# Patient Record
Sex: Female | Born: 1980 | Race: Black or African American | Hispanic: No | Marital: Single | Smoking: Never smoker
Health system: Southern US, Community
[De-identification: ages and names within clinical notes are randomized; demographics above are authoritative.]

## PROBLEM LIST (undated history)

## (undated) DIAGNOSIS — I10 Essential (primary) hypertension: Secondary | ICD-10-CM

---

## 2017-04-13 DIAGNOSIS — Z01818 Encounter for other preprocedural examination: Secondary | ICD-10-CM | POA: Diagnosis not present

## 2017-04-13 DIAGNOSIS — Z9884 Bariatric surgery status: Secondary | ICD-10-CM | POA: Diagnosis not present

## 2017-04-13 DIAGNOSIS — Z7189 Other specified counseling: Secondary | ICD-10-CM | POA: Diagnosis not present

## 2017-08-08 DIAGNOSIS — E559 Vitamin D deficiency, unspecified: Secondary | ICD-10-CM | POA: Diagnosis not present

## 2017-08-08 DIAGNOSIS — I10 Essential (primary) hypertension: Secondary | ICD-10-CM | POA: Diagnosis not present

## 2018-11-01 DIAGNOSIS — D500 Iron deficiency anemia secondary to blood loss (chronic): Secondary | ICD-10-CM | POA: Diagnosis not present

## 2018-11-01 DIAGNOSIS — I10 Essential (primary) hypertension: Secondary | ICD-10-CM | POA: Diagnosis not present

## 2018-12-06 DIAGNOSIS — Z01419 Encounter for gynecological examination (general) (routine) without abnormal findings: Secondary | ICD-10-CM | POA: Diagnosis not present

## 2018-12-06 DIAGNOSIS — Z113 Encounter for screening for infections with a predominantly sexual mode of transmission: Secondary | ICD-10-CM | POA: Diagnosis not present

## 2018-12-06 DIAGNOSIS — Z118 Encounter for screening for other infectious and parasitic diseases: Secondary | ICD-10-CM | POA: Diagnosis not present

## 2018-12-06 DIAGNOSIS — Z6841 Body Mass Index (BMI) 40.0 and over, adult: Secondary | ICD-10-CM | POA: Diagnosis not present

## 2018-12-27 DIAGNOSIS — F411 Generalized anxiety disorder: Secondary | ICD-10-CM | POA: Diagnosis not present

## 2019-01-10 DIAGNOSIS — F411 Generalized anxiety disorder: Secondary | ICD-10-CM | POA: Diagnosis not present

## 2019-03-12 DIAGNOSIS — F411 Generalized anxiety disorder: Secondary | ICD-10-CM | POA: Diagnosis not present

## 2019-03-20 DIAGNOSIS — F411 Generalized anxiety disorder: Secondary | ICD-10-CM | POA: Diagnosis not present

## 2019-03-25 DIAGNOSIS — F411 Generalized anxiety disorder: Secondary | ICD-10-CM | POA: Diagnosis not present

## 2019-04-08 DIAGNOSIS — F411 Generalized anxiety disorder: Secondary | ICD-10-CM | POA: Diagnosis not present

## 2019-04-17 DIAGNOSIS — F411 Generalized anxiety disorder: Secondary | ICD-10-CM | POA: Diagnosis not present

## 2021-02-22 ENCOUNTER — Other Ambulatory Visit: Payer: Self-pay

## 2021-02-22 ENCOUNTER — Encounter (HOSPITAL_BASED_OUTPATIENT_CLINIC_OR_DEPARTMENT_OTHER): Payer: Self-pay | Admitting: *Deleted

## 2021-02-22 DIAGNOSIS — K219 Gastro-esophageal reflux disease without esophagitis: Secondary | ICD-10-CM | POA: Insufficient documentation

## 2021-02-22 DIAGNOSIS — Z79899 Other long term (current) drug therapy: Secondary | ICD-10-CM | POA: Insufficient documentation

## 2021-02-22 DIAGNOSIS — I10 Essential (primary) hypertension: Secondary | ICD-10-CM | POA: Insufficient documentation

## 2021-02-22 LAB — CBG MONITORING, ED: Glucose-Capillary: 141 mg/dL — ABNORMAL HIGH (ref 70–99)

## 2021-02-22 NOTE — ED Triage Notes (Signed)
Pt. Reports she cant urinate at this time. 

## 2021-02-22 NOTE — ED Triage Notes (Signed)
Pt. Reports she tried to eat a [place] burger and after 1/2 she started to vomit.  She has vomited x 8 with nausea still .  Pt. Reports she has had chills and sweating.  No diarrhea per Pt.

## 2021-02-23 ENCOUNTER — Emergency Department (HOSPITAL_BASED_OUTPATIENT_CLINIC_OR_DEPARTMENT_OTHER)
Admission: EM | Admit: 2021-02-23 | Discharge: 2021-02-23 | Disposition: A | Discharge status: Home / Self Care | Payer: Self-pay | Attending: Emergency Medicine | Admitting: Emergency Medicine

## 2021-02-23 ENCOUNTER — Encounter (HOSPITAL_BASED_OUTPATIENT_CLINIC_OR_DEPARTMENT_OTHER): Payer: Self-pay | Admitting: Emergency Medicine

## 2021-02-23 DIAGNOSIS — R112 Nausea with vomiting, unspecified: Secondary | ICD-10-CM

## 2021-02-23 DIAGNOSIS — K219 Gastro-esophageal reflux disease without esophagitis: Secondary | ICD-10-CM

## 2021-02-23 DIAGNOSIS — N390 Urinary tract infection, site not specified: Secondary | ICD-10-CM

## 2021-02-23 HISTORY — DX: Essential (primary) hypertension: I10

## 2021-02-23 LAB — URINALYSIS, MICROSCOPIC (REFLEX)

## 2021-02-23 LAB — PREGNANCY, URINE: Preg Test, Ur: NEGATIVE

## 2021-02-23 LAB — URINALYSIS, ROUTINE W REFLEX MICROSCOPIC
Bilirubin Urine: NEGATIVE
Glucose, UA: NEGATIVE mg/dL
Hgb urine dipstick: NEGATIVE
Ketones, ur: >80 mg/dL — AB
Nitrite: POSITIVE — AB
Protein, ur: 30 mg/dL — AB
Specific Gravity, Urine: >1.03 — ABNORMAL HIGH (ref 1.005–1.030)
pH: 6 (ref 5.0–8.0)

## 2021-02-23 MED ORDER — OMEPRAZOLE 20 MG PO CPDR: 20.0000 mg | DELAYED_RELEASE_CAPSULE | Freq: Every day | ORAL | 0 refills | Status: AC

## 2021-02-23 MED ORDER — ALUM & MAG HYDROXIDE-SIMETH 200-200-20 MG/5ML PO SUSP
30.0000 mL | Freq: Once | ORAL | Status: AC
  Administered 2021-02-23: 30 mL via ORAL

## 2021-02-23 MED ORDER — ONDANSETRON 4 MG PO TBDP
8.0000 mg | ORAL_TABLET | Freq: Once | ORAL | Status: AC
  Administered 2021-02-23: 8 mg via ORAL

## 2021-02-23 MED ORDER — CEPHALEXIN 500 MG PO CAPS: 500.0000 mg | ORAL_CAPSULE | Freq: Four times a day (QID) | ORAL | 0 refills | Status: AC

## 2021-02-23 MED ORDER — ONDANSETRON 4 MG PO TBDP
ORAL_TABLET | ORAL | Status: AC
  Filled 2021-02-23: qty 2

## 2021-02-23 MED ORDER — ONDANSETRON 8 MG PO TBDP: ORAL_TABLET | ORAL | 0 refills | Status: DC

## 2021-02-23 MED ORDER — CEPHALEXIN 250 MG PO CAPS
ORAL_CAPSULE | ORAL | Status: AC
  Filled 2021-02-23: qty 2

## 2021-02-23 MED ORDER — CEPHALEXIN 250 MG PO CAPS
500.0000 mg | ORAL_CAPSULE | Freq: Once | ORAL | Status: AC
  Administered 2021-02-23: 500 mg via ORAL

## 2021-02-23 MED ORDER — ALUM & MAG HYDROXIDE-SIMETH 200-200-20 MG/5ML PO SUSP
ORAL | Status: AC
  Filled 2021-02-23: qty 30

## 2021-02-23 NOTE — ED Provider Notes (Addendum)
[facility] EMERGENCY DEPARTMENT Provider Note   CSN: [id number] Arrival date & time: 02/22/21  2314     History Chief Complaint  Patient presents with  . Emesis    Alice Case is a 41 y.o. female.  The history is provided by the patient.  Emesis Severity:  Moderate Timing:  Sporadic Number of daily episodes:  8 Quality:  Stomach contents Able to tolerate:  Liquids Progression:  Resolved Chronicity:  New Recent urination:  Normal Context: not self-induced   Relieved by:  Nothing Worsened by:  Nothing Ineffective treatments:  None tried Associated symptoms: no abdominal pain, no arthralgias, no chills, no cough, no diarrhea, no fever, no headaches, no myalgias, no sore throat and no URI   Risk factors: no alcohol use   Patient ate a [place] burger this evening and began having emesis within 1 hour of ingestion.  No f/c/r.  No abdominal pain. No chest pain.  No SOB.  No diarrhea.  She states this happens if she eats something that is greasy or spicy.  No further emesis since being in the ED    Past Medical History:  Diagnosis Date  . Hypertension     There are no problems to display for this patient.   History reviewed. No pertinent surgical history.   OB History   No obstetric history on file.     History reviewed. No pertinent family history.     Home Medications Prior to Admission medications   Medication Sig Start Date End Date Taking? Authorizing Provider  Norethindrone Acetate-Ethinyl Estrad-FE (BLISOVI 24 FE) 1-20 MG-MCG(24) tablet  10/08/20  Yes [provider]  omeprazole (PRILOSEC) 20 MG capsule Take 1 capsule (20 mg total) by mouth daily. 02/23/21  Yes Tynia Wiers, MD  ondansetron (ZOFRAN ODT) 8 MG disintegrating tablet 8mg  ODT q8 hours prn nausea 02/23/21  Yes Stryder Poitra, MD  telmisartan-hydrochlorothiazide (MICARDIS HCT) 80-25 MG tablet Take 1 tablet by mouth daily. 01/01/21  Yes [provider]    Allergies     Patient has no known allergies.  Review of Systems   Review of Systems  Constitutional: Negative for chills and fever.  HENT: Negative for sore throat.   Eyes: Negative for visual disturbance.  Respiratory: Negative for cough.   Gastrointestinal: Positive for nausea and vomiting. Negative for abdominal pain, constipation and diarrhea.  Genitourinary: Negative for difficulty urinating and dysuria.  Musculoskeletal: Negative for arthralgias and myalgias.  Skin: Negative for pallor.  Neurological: Negative for headaches.  Psychiatric/Behavioral: Negative for confusion.  All other systems reviewed and are negative.   Physical Exam Updated Vital Signs BP 110/60 (BP Location: Left Arm)   Pulse 84   Temp 97.9 F (36.6 C) (Oral)   Resp 20   Ht 5\' 3"  (1.6 m)   Wt (!) 149.7 kg   LMP 02/15/2021   SpO2 100%   BMI 58.46 kg/m   Physical Exam Vitals and nursing note reviewed.  Constitutional:      General: She is not in acute distress.    Appearance: Normal appearance.  HENT:     Head: Normocephalic and atraumatic.     Nose: Nose normal.  Eyes:     Conjunctiva/sclera: Conjunctivae normal.     Pupils: Pupils are equal, round, and reactive to light.  Cardiovascular:     Rate and Rhythm: Normal rate and regular rhythm.     Pulses: Normal pulses.     Heart sounds: Normal heart sounds.  Pulmonary:  Effort: Pulmonary effort is normal.     Breath sounds: Normal breath sounds.  Abdominal:     General: Abdomen is flat.     Palpations: Abdomen is soft. There is no mass.     Tenderness: There is no abdominal tenderness. There is no guarding or rebound.     Hernia: No hernia is present.  Musculoskeletal:        General: Normal range of motion.     Cervical back: Normal range of motion and neck supple.  Skin:    General: Skin is warm and dry.     Capillary Refill: Capillary refill takes less than 2 seconds.  Neurological:     General: No focal deficit present.     Mental  Status: She is alert and oriented to person, place, and time.     Deep Tendon Reflexes: Reflexes normal.  Psychiatric:        Mood and Affect: Mood normal.        Behavior: Behavior normal.     ED Results / Procedures / Treatments   Labs (all labs ordered are listed, but only abnormal results are displayed) Results for orders placed or performed during the hospital encounter of 02/23/21  Pregnancy, urine  Result Value Ref Range   Preg Test, Ur NEGATIVE NEGATIVE  Urinalysis, Routine w reflex microscopic Urine, Clean Catch  Result Value Ref Range   Color, Urine YELLOW YELLOW   APPearance CLOUDY (A) CLEAR   Specific Gravity, Urine >1.030 (H) 1.005 - 1.030   pH 6.0 5.0 - 8.0   Glucose, UA NEGATIVE NEGATIVE mg/dL   Hgb urine dipstick NEGATIVE NEGATIVE   Bilirubin Urine NEGATIVE NEGATIVE   Ketones, ur >80 (A) NEGATIVE mg/dL   Protein, ur 30 (A) NEGATIVE mg/dL   Nitrite POSITIVE (A) NEGATIVE   Leukocytes,Ua SMALL (A) NEGATIVE  Urinalysis, Microscopic (reflex)  Result Value Ref Range   RBC / HPF 0-5 0 - 5 RBC/hpf   WBC, UA 11-20 0 - 5 WBC/hpf   Bacteria, UA MANY (A) NONE SEEN   Squamous Epithelial / LPF 6-10 0 - 5   Mucus PRESENT   CBG monitoring, ED  Result Value Ref Range   Glucose-Capillary 141 (H) 70 - 99 mg/dL   No results found.  EKG None  Radiology No results found.  Procedures Procedures   Medications Ordered in ED Medications  ondansetron (ZOFRAN-ODT) disintegrating tablet 8 mg (has no administration in time range)  alum & mag hydroxide-simeth (MAALOX/MYLANTA) 200-200-20 MG/5ML suspension 30 mL (has no administration in time range)  cephALEXin (KEFLEX) capsule 500 mg (has no administration in time range)    ED Course  I have reviewed the triage vital signs and the nursing notes.  Pertinent labs & imaging results that were available during my care of the patient were reviewed by me and considered in my medical decision making (see chart for  details).   Patient is sleeping in the room each time EDP enters.     Given history of emesis with greasy and spicy food without abdominal pain I suspect this is GERD.  I do not believe imaging is indicated at this time as the patient does not have abdominal pain or fever.  Exam and vitals are benign and reassuring and vomited has ceased since checking in to the ED. I have searched the room for any signs of emesis and there are none. Patient is instructed to abstain from eating greasy and spicy food and to observe a gerd friendly  diet and to follow up with her PMD and GI for ongoing care.  Medication given and patient has PO challenged Successfully in the ED.  RX for zofran and PPI given.  Patient also treated for UTI based on urine in the ED.   Family member is demanding tests for viruses. They want to know why we do not have this test and want to know if we lack the equipment.  EDP politely explained that there is no viral test available and apologizes.  EDP explained politely that we do not test for "viruses" associated with emesis.  EDP explained that we do not have functional testing for GERD. The sister believes she has had a test for GERD somewhere.  EDP explained that the ED does not do these sorts of tests but that GI and PMDs can order these tests.  Moreover, the patient has had no emesis in in the ED.  Nothing in emesis bag or sink or trash, I have checked.  Patient is sleeping soundly in the room.  Family is again asking if patient will be sent hom with medication and EDP again explained that there would be Prilosec, zofran,  and keflex to treat UTI and I have apologized that we do not have the tests the patient's family wants to give her the answers.  EDP explained that we would provide referral to specialist.  Family member reiterates that she has had some sort of testing perhaps here perhaps elsewhere for several conditions and has "always raved about the ER because you have all the tests." No  matter what I have said and apologized for the family member has additional tests she thinks the patient needs that do not exist or that we do not have access to.   I asked if the patient has ever mentioned these symptoms to her PMD and no answer was given.  I have apologized again that we do not have what the family is seeking.  I explained that we are treating the patient's symptoms and providing prescriptions to help alleviate her symptoms in the future as well as referral to a specialist to help the patient.  I have again advised the patient also follow up with her PMD.  Nurse has also been in to reiterate that we do not have the tests they are interested in.   Patient's GI cocktail is at the bedside and patient has not taken it. Patient is sleeping but family continues to question EDP  Alice Case was evaluated in Emergency Department on 02/23/2021 for the symptoms described in the history of present illness. She was evaluated in the context of the global COVID-19 pandemic, which necessitated consideration that the patient might be at risk for infection with the SARS-CoV-2 virus that causes COVID-19. Institutional protocols and algorithms that pertain to the evaluation of patients at risk for COVID-19 are in a state of rapid change based on information released by regulatory bodies including the CDC and federal and state organizations. These policies and algorithms were followed during the patient's care in the ED.  Final Clinical Impression(s) / ED Diagnoses Final diagnoses:  Non-intractable vomiting with nausea, unspecified vomiting type  Gastroesophageal reflux disease, unspecified whether esophagitis present    Return for intractable cough, coughing up blood, fevers >100.4 unrelieved by medication, shortness of breath, intractable vomiting, chest pain, shortness of breath, weakness, numbness, changes in speech, facial asymmetry, abdominal pain, passing out, Inability to tolerate liquids or  food, cough, altered mental status or any concerns.  No signs of systemic illness or infection. The patient is nontoxic-appearing on exam and vital signs are within normal limits.  I have reviewed the triage vital signs and the nursing notes. Pertinent labs & imaging results that were available during my care of the patient were reviewed by me and considered in my medical decision making (see chart for details). After history, exam, and medical workup I feel the patient has been appropriately medically screened and is safe for discharge home. Pertinent diagnoses were discussed with the patient. Patient was given return precautions.        Hedaya Latendresse, MD 02/23/21 [id number]

## 2021-02-23 NOTE — ED Notes (Signed)
Pt family member is requesting to speak with MD and wants "viral tests" and an ultrasound. MD made aware.

## 2021-02-23 NOTE — ED Notes (Signed)
ED Provider at bedside. 

## 2021-02-23 NOTE — ED Triage Notes (Signed)
Pt. Wants RN to put in her chart that she was not given any nausea meds in triage.  RN did not give her meds due to the Pt. Is going straight back to a room and the EDP will place orders and the Pt. Is not actively vomiting in triage.  Pt. VS are stable and no s/s of distress.  Pt. Was given explanation of why she did not get meds in triage.

## 2021-05-18 ENCOUNTER — Other Ambulatory Visit: Payer: Self-pay

## 2021-05-18 ENCOUNTER — Emergency Department (HOSPITAL_BASED_OUTPATIENT_CLINIC_OR_DEPARTMENT_OTHER)
Admission: EM | Admit: 2021-05-18 | Discharge: 2021-05-18 | Disposition: A | Discharge status: Home / Self Care | Payer: PRIVATE HEALTH INSURANCE | Attending: Emergency Medicine | Admitting: Emergency Medicine

## 2021-05-18 ENCOUNTER — Emergency Department (HOSPITAL_BASED_OUTPATIENT_CLINIC_OR_DEPARTMENT_OTHER): Payer: PRIVATE HEALTH INSURANCE

## 2021-05-18 ENCOUNTER — Encounter (HOSPITAL_BASED_OUTPATIENT_CLINIC_OR_DEPARTMENT_OTHER): Payer: Self-pay

## 2021-05-18 ENCOUNTER — Other Ambulatory Visit (HOSPITAL_BASED_OUTPATIENT_CLINIC_OR_DEPARTMENT_OTHER): Payer: Self-pay

## 2021-05-18 DIAGNOSIS — I10 Essential (primary) hypertension: Secondary | ICD-10-CM | POA: Insufficient documentation

## 2021-05-18 DIAGNOSIS — R112 Nausea with vomiting, unspecified: Secondary | ICD-10-CM | POA: Diagnosis not present

## 2021-05-18 DIAGNOSIS — R1013 Epigastric pain: Secondary | ICD-10-CM | POA: Insufficient documentation

## 2021-05-18 DIAGNOSIS — R197 Diarrhea, unspecified: Secondary | ICD-10-CM | POA: Diagnosis not present

## 2021-05-18 DIAGNOSIS — R1033 Periumbilical pain: Secondary | ICD-10-CM | POA: Diagnosis not present

## 2021-05-18 DIAGNOSIS — R748 Abnormal levels of other serum enzymes: Secondary | ICD-10-CM | POA: Diagnosis not present

## 2021-05-18 DIAGNOSIS — Z79899 Other long term (current) drug therapy: Secondary | ICD-10-CM | POA: Diagnosis not present

## 2021-05-18 DIAGNOSIS — R1084 Generalized abdominal pain: Secondary | ICD-10-CM | POA: Insufficient documentation

## 2021-05-18 LAB — CBC WITH DIFFERENTIAL/PLATELET
Abs Immature Granulocytes: 0.03 10*3/uL (ref 0.00–0.07)
Basophils Absolute: 0.1 10*3/uL (ref 0.0–0.1)
Basophils Relative: 1 %
Eosinophils Absolute: 0 10*3/uL (ref 0.0–0.5)
Eosinophils Relative: 0 %
HCT: 38.9 % (ref 36.0–46.0)
Hemoglobin: 13.4 g/dL (ref 12.0–15.0)
Immature Granulocytes: 0 %
Lymphocytes Relative: 26 %
Lymphs Abs: 2.3 10*3/uL (ref 0.7–4.0)
MCH: 30.5 pg (ref 26.0–34.0)
MCHC: 34.4 g/dL (ref 30.0–36.0)
MCV: 88.6 fL (ref 80.0–100.0)
Monocytes Absolute: 0.7 10*3/uL (ref 0.1–1.0)
Monocytes Relative: 8 %
Neutro Abs: 5.7 10*3/uL (ref 1.7–7.7)
Neutrophils Relative %: 65 %
Platelets: 367 10*3/uL (ref 150–400)
RBC: 4.39 MIL/uL (ref 3.87–5.11)
RDW: 13.2 % (ref 11.5–15.5)
WBC: 8.8 10*3/uL (ref 4.0–10.5)
nRBC: 0 % (ref 0.0–0.2)

## 2021-05-18 LAB — COMPREHENSIVE METABOLIC PANEL
ALT: 19 U/L (ref 0–44)
AST: 19 U/L (ref 15–41)
Albumin: 3.8 g/dL (ref 3.5–5.0)
Alkaline Phosphatase: 49 U/L (ref 38–126)
Anion gap: 12 (ref 5–15)
BUN: 9 mg/dL (ref 6–20)
CO2: 26 mmol/L (ref 22–32)
Calcium: 9.1 mg/dL (ref 8.9–10.3)
Chloride: 93 mmol/L — ABNORMAL LOW (ref 98–111)
Creatinine, Ser: 1 mg/dL (ref 0.44–1.00)
GFR, Estimated: >60 mL/min (ref >60)
Glucose, Bld: 87 mg/dL (ref 70–99)
Potassium: 3.2 mmol/L — ABNORMAL LOW (ref 3.5–5.1)
Sodium: 131 mmol/L — ABNORMAL LOW (ref 135–145)
Total Bilirubin: 0.6 mg/dL (ref 0.3–1.2)
Total Protein: 7.9 g/dL (ref 6.5–8.1)

## 2021-05-18 LAB — URINALYSIS, ROUTINE W REFLEX MICROSCOPIC
Glucose, UA: NEGATIVE mg/dL
Hgb urine dipstick: NEGATIVE
Ketones, ur: >80 mg/dL — AB
Nitrite: NEGATIVE
Protein, ur: NEGATIVE mg/dL
Specific Gravity, Urine: 1.025 (ref 1.005–1.030)
pH: 6 (ref 5.0–8.0)

## 2021-05-18 LAB — URINALYSIS, MICROSCOPIC (REFLEX)

## 2021-05-18 LAB — HCG, SERUM, QUALITATIVE: Preg, Serum: NEGATIVE

## 2021-05-18 LAB — LIPASE, BLOOD: Lipase: 145 U/L — ABNORMAL HIGH (ref 11–51)

## 2021-05-18 MED ORDER — PANTOPRAZOLE SODIUM 40 MG IV SOLR
40.0000 mg | Freq: Once | INTRAVENOUS | Status: AC
  Administered 2021-05-18: 40 mg via INTRAVENOUS
  Filled 2021-05-18: qty 40

## 2021-05-18 MED ORDER — ONDANSETRON 4 MG PO TBDP
8.0000 mg | ORAL_TABLET | Freq: Three times a day (TID) | ORAL | 0 refills | Status: AC | PRN
  Filled 2021-05-18: qty 20, 4d supply, fill #0

## 2021-05-18 MED ORDER — PANTOPRAZOLE SODIUM 20 MG PO TBEC
20.0000 mg | DELAYED_RELEASE_TABLET | Freq: Every day | ORAL | 0 refills | Status: AC
  Filled 2021-05-18: qty 30, 30d supply, fill #0

## 2021-05-18 MED ORDER — MORPHINE SULFATE (PF) 4 MG/ML IV SOLN
4.0000 mg | Freq: Once | INTRAVENOUS | Status: AC
  Administered 2021-05-18: 4 mg via INTRAVENOUS
  Filled 2021-05-18: qty 1

## 2021-05-18 MED ORDER — SODIUM CHLORIDE 0.9 % IV BOLUS
1000.0000 mL | Freq: Once | INTRAVENOUS | Status: AC
Start: 2021-05-18 — End: 2021-05-18
  Administered 2021-05-18: 1000 mL via INTRAVENOUS

## 2021-05-18 MED ORDER — ONDANSETRON HCL 4 MG/2ML IJ SOLN
4.0000 mg | Freq: Once | INTRAMUSCULAR | Status: AC
  Administered 2021-05-18: 4 mg via INTRAVENOUS
  Filled 2021-05-18: qty 2

## 2021-05-18 MED ORDER — SUCRALFATE 1 G PO TABS
1.0000 g | ORAL_TABLET | Freq: Three times a day (TID) | ORAL | 0 refills | Status: AC
  Filled 2021-05-18: qty 56, 14d supply, fill #0

## 2021-05-18 MED ORDER — IOHEXOL 300 MG/ML  SOLN
100.0000 mL | Freq: Once | INTRAMUSCULAR | Status: AC | PRN
  Administered 2021-05-18: 100 mL via INTRAVENOUS

## 2021-05-18 NOTE — ED Notes (Signed)
Ginger ale provided for po challenge. 

## 2021-05-18 NOTE — ED Notes (Signed)
Pt provided discharge instructions and prescription information. Pt was given the opportunity to ask questions and questions were answered. Discharge signature not obtained in the setting of the COVID-19 pandemic in order to reduce high touch surfaces.  ° °

## 2021-05-18 NOTE — ED Provider Notes (Signed)
[facility] EMERGENCY DEPARTMENT Provider Note   CSN: [id number] Arrival date & time: 05/18/21  1137    History Chief Complaint  Patient presents with   Abdominal Pain    Alice Case is a 40 y.o. female with past medical history significant for hypertension who presents for evaluation of abdominal pain and emesis.  Patient with diffuse midline abdominal pain.  Does not radiate into her back.  No pain to right upper quadrant.  Has history of reflux has been taking medications without relief.  States she is also not been able to tolerate any p.o. intake over the last week.  She has had multiple episodes of NBNB emesis.  Patient also with watery diarrhea without melena or bright blood per rectum.  No fever, chills, cough, chest pain, shortness of breath.  No pelvic pain, vaginal discharge.  Denies chance of pregnancy.  No dysuria, hematuria.  No known sick contacts.  No recent antibiotics or travel.  No chronic NSAID use, EtOh use. Denies additional aggravating or alleviating factors. Has been taking Dramamine as well as sisters anxiety medication which has not helped.  Has been seen previously for similar complaints. Has not been seen by GI.  Similar pain in April seen at OSH. CTAP without acute findings  History obtained from patient and past medical records.  No interpreter used.  HPI     Past Medical History:  Diagnosis Date   Hypertension     There are no problems to display for this patient.   History reviewed. No pertinent surgical history.   OB History   No obstetric history on file.     No family history on file.  Social History   Tobacco Use   Smoking status: Never   Smokeless tobacco: Never  Substance Use Topics   Alcohol use: Yes    Comment: rare   Drug use: Yes    Types: Marijuana    Home Medications Prior to Admission medications   Medication Sig Start Date End Date Taking? Authorizing Provider  ondansetron (ZOFRAN ODT) 4 MG  disintegrating tablet Take 2 tablets (8 mg total) by mouth every 8 (eight) hours as needed for nausea or vomiting. 05/18/21  Yes Cisco Kindt A, PA-C  pantoprazole (PROTONIX) 20 MG tablet Take 1 tablet (20 mg total) by mouth daily. 05/18/21 06/17/21 Yes Collie Kittel A, PA-C  sucralfate (CARAFATE) 1 g tablet Take 1 tablet (1 g total) by mouth 4 (four) times daily -  with meals and at bedtime for 14 days. 05/18/21 06/01/21 Yes Asherah Lavoy A, PA-C  cephALEXin (KEFLEX) 500 MG capsule Take 1 capsule (500 mg total) by mouth 4 (four) times daily. 02/23/21   Palumbo, April, MD  Norethindrone Acetate-Ethinyl Estrad-FE (BLISOVI 24 FE) 1-20 MG-MCG(24) tablet  10/08/20   [provider]  omeprazole (PRILOSEC) 20 MG capsule Take 1 capsule (20 mg total) by mouth daily. 02/23/21   Palumbo, April, MD  telmisartan-hydrochlorothiazide (MICARDIS HCT) 80-25 MG tablet Take 1 tablet by mouth daily. 01/01/21   [provider]    Allergies    Patient has no known allergies.  Review of Systems   Review of Systems  Constitutional: Negative.   HENT: Negative.    Respiratory: Negative.    Cardiovascular: Negative.   Gastrointestinal:  Positive for abdominal pain, diarrhea, nausea and vomiting. Negative for abdominal distention, anal bleeding, blood in stool, constipation and rectal pain.  Genitourinary: Negative.   Musculoskeletal: Negative.   Skin: Negative.   Neurological: Negative.  All other systems reviewed and are negative.  Physical Exam Updated Vital Signs BP (!) 156/88 (BP Location: Right Arm)   Pulse 73   Temp 98.4 F (36.9 C) (Oral)   Resp 16   Ht 5\' 3"  (1.6 m)   Wt 134.7 kg   LMP 05/04/2021   SpO2 100%   BMI 52.61 kg/m   Physical Exam Vitals and nursing note reviewed.  Constitutional:      General: She is not in acute distress.    Appearance: She is well-developed. She is obese. She is not ill-appearing.  HENT:     Head: Normocephalic and atraumatic.      Mouth/Throat:     Mouth: Mucous membranes are moist.  Eyes:     Pupils: Pupils are equal, round, and reactive to light.  Cardiovascular:     Rate and Rhythm: Normal rate.     Heart sounds: Normal heart sounds.  Pulmonary:     Effort: Pulmonary effort is normal. No respiratory distress.     Breath sounds: Normal breath sounds.     Comments: Clear to auscultation bilaterally.  Speaks in full sentences without difficulty Abdominal:     General: Bowel sounds are normal. There is no distension.     Palpations: Abdomen is soft.     Tenderness: There is generalized abdominal tenderness and tenderness in the epigastric area and periumbilical area. There is no guarding or rebound. Negative signs include Murphy's sign and McBurney's sign.     Comments: Difficult exam due to patient's body habitus. Diffuse tenderness to midline abdomen, worse in epigastric region.  Negative Murphy sign.  Musculoskeletal:        General: Normal range of motion.     Cervical back: Normal range of motion.  Skin:    General: Skin is warm and dry.  Neurological:     General: No focal deficit present.     Mental Status: She is alert.  Psychiatric:        Mood and Affect: Mood normal.   ED Results / Procedures / Treatments   Labs (all labs ordered are listed, but only abnormal results are displayed) Labs Reviewed  COMPREHENSIVE METABOLIC PANEL - Abnormal; Notable for the following components:      Result Value   Sodium 131 (*)    Potassium 3.2 (*)    Chloride 93 (*)    All other components within normal limits  LIPASE, BLOOD - Abnormal; Notable for the following components:   Lipase 145 (*)    All other components within normal limits  URINALYSIS, ROUTINE W REFLEX MICROSCOPIC - Abnormal; Notable for the following components:   APPearance CLOUDY (*)    Bilirubin Urine MODERATE (*)    Ketones, ur >80 (*)    Leukocytes,Ua TRACE (*)    All other components within normal limits  URINALYSIS, MICROSCOPIC  (REFLEX) - Abnormal; Notable for the following components:   Bacteria, UA RARE (*)    All other components within normal limits  CBC WITH DIFFERENTIAL/PLATELET  HCG, SERUM, QUALITATIVE    EKG None  Radiology CT Abdomen Pelvis W Contrast  Result Date: 05/18/2021 CLINICAL DATA:  Diarrhea and emesis for 1 week. EXAM: CT ABDOMEN AND PELVIS WITH CONTRAST TECHNIQUE: Multidetector CT imaging of the abdomen and pelvis was performed using the standard protocol following bolus administration of intravenous contrast. CONTRAST:  05/20/2021 OMNIPAQUE IOHEXOL 300 MG/ML  SOLN COMPARISON:  CT abdomen pelvis 03/17/2021 FINDINGS: Lower chest: No acute abnormality. Hepatobiliary: No focal liver abnormality is  seen. No gallstones, gallbladder wall thickening, or biliary dilatation. Pancreas: Unremarkable. No pancreatic ductal dilatation or surrounding inflammatory changes. Spleen: Normal in size without focal abnormality. Adrenals/Urinary Tract: Adrenal glands are unremarkable. Kidneys are normal, without renal calculi, focal lesion, or hydronephrosis. Bladder is unremarkable. Stomach/Bowel: Stomach is within normal limits. Appendix appears normal. No evidence of bowel wall thickening, distention, or inflammatory changes. Vascular/Lymphatic: No significant vascular findings are present. No enlarged abdominal or pelvic lymph nodes. Reproductive: Uterus and bilateral adnexa are unremarkable. Other: No abdominal wall hernia or abnormality. No abdominopelvic ascites. Musculoskeletal: No acute or significant osseous findings. IMPRESSION: No acute findings in the abdomen or pelvis. Electronically Signed   By: Emmaline KluverNancy  Ballantyne M.D.   On: 05/18/2021 15:08    Procedures Procedures   Medications Ordered in ED Medications  ondansetron (ZOFRAN) injection 4 mg (4 mg Intravenous Given 05/18/21 1341)  sodium chloride 0.9 % bolus 1,000 mL (0 mLs Intravenous Stopped 05/18/21 1521)  morphine 4 MG/ML injection 4 mg (4 mg Intravenous Given  05/18/21 1341)  pantoprazole (PROTONIX) injection 40 mg (40 mg Intravenous Given 05/18/21 1343)  iohexol (OMNIPAQUE) 300 MG/ML solution 100 mL (100 mLs Intravenous Contrast Given 05/18/21 1432)   ED Course  I have reviewed the triage vital signs and the nursing notes.  Pertinent labs & imaging results that were available during my care of the patient were reviewed by me and considered in my medical decision making (see chart for details).  Here for evaluation of abdominal pain, emesis and diarrhea over the last week.  She is afebrile, nonseptic, not ill-appearing.  Heart and lungs clear.  Abdomen diffusely tender to midline abdomen however worse epigastric region.  Negative Murphy sign.  NBNB emesis, no melena or blood per rectum in her stool.  No recent antibiotics or travel.  No chronic NSAID use or EtOH use.  Is been compliant with her reflux medications.  Plan on labs, imaging and reassess  Labs and imaging personally reviewed and interpreted:  CBC without leukocytosis CMP sodium 131, potassium 3.2 will supplement Lipase 145 UA trace leuks, rare bacteria no UTI symptoms Preg negative CTAP wo acute findings  Patient reassessed.  Symptoms significantly improved here in the emergency department.  I discussed labs and imaging with patient and family in room.  While she does have a mildly elevated lipase there is no inflammatory changes on her CT scan.  We will p.o. challenge  Given she has had this pain for months do feel she would likely benefit from follow-up with GI.  Will start on Carafate, PPI.  Discussed clear liquid diet over the next few days with gradual increase as well as reflux diet.  Will place referral for GI doctor. Tolerated PO challenge here in ED.  Patient is nontoxic, nonseptic appearing, in no apparent distress.  Patient's pain and other symptoms adequately managed in emergency department.  Fluid bolus given.  Labs, imaging and vitals reviewed.  Patient does not meet the SIRS  or Sepsis criteria.  On repeat exam patient does not have a surgical abdomin and there are no peritoneal signs.  No indication of appendicitis, bowel obstruction, bowel perforation, cholecystitis, diverticulitis, PID or ectopic pregnancy.  Patient discharged home with symptomatic treatment and given strict instructions for follow-up with their primary care physician.  I have also discussed reasons to return immediately to the ER.  Patient expresses understanding and agrees with plan.      MDM Rules/Calculators/A&P  Final Clinical Impression(s) / ED Diagnoses Final diagnoses:  Epigastric pain  Elevated lipase  Nausea vomiting and diarrhea    Rx / DC Orders ED Discharge Orders          Ordered    sucralfate (CARAFATE) 1 g tablet  3 times daily with meals & bedtime        05/18/21 1615    pantoprazole (PROTONIX) 20 MG tablet  Daily        05/18/21 1615    ondansetron (ZOFRAN ODT) 4 MG disintegrating tablet  Every 8 hours PRN        05/18/21 1617             Frimet Durfee A, PA-C 05/18/21 1651    Tegeler, Canary Brim, MD 05/19/21 [phone]

## 2021-05-18 NOTE — Discharge Instructions (Addendum)
It was a pleasure taking care of you here in the emergency department.  I would start taking the Carafate as well as the Protonix for your symptoms.  Follow the food choices that I have attached  Take the nausea medicine, Zofran as needed for nausea and vomiting  Call your primary care provider to let them know you are seen here in the emergency department  Schedule appoint with a gastroenterologist  Return here for any new or worsening symptoms

## 2021-05-18 NOTE — ED Notes (Signed)
This RN attempted IV, unable to obtain, looked with [place] as well. ED MD Tegeler made aware and will attempt [place] IV.

## 2021-05-18 NOTE — ED Notes (Signed)
Patient transported to CT 

## 2021-05-18 NOTE — ED Triage Notes (Signed)
Pt c/o abd pain, n/v x 1 month-states she has been seen in EDs multiple visit-unable to afford GI visit-NAD-steady gait

## 2021-05-20 ENCOUNTER — Other Ambulatory Visit: Payer: Self-pay

## 2021-05-20 ENCOUNTER — Emergency Department (HOSPITAL_BASED_OUTPATIENT_CLINIC_OR_DEPARTMENT_OTHER)
Admission: EM | Admit: 2021-05-20 | Discharge: 2021-05-20 | Disposition: A | Discharge status: Home / Self Care | Payer: PRIVATE HEALTH INSURANCE | Attending: Emergency Medicine | Admitting: Emergency Medicine

## 2021-05-20 ENCOUNTER — Encounter (HOSPITAL_BASED_OUTPATIENT_CLINIC_OR_DEPARTMENT_OTHER): Payer: Self-pay | Admitting: Emergency Medicine

## 2021-05-20 ENCOUNTER — Other Ambulatory Visit (HOSPITAL_BASED_OUTPATIENT_CLINIC_OR_DEPARTMENT_OTHER): Payer: Self-pay

## 2021-05-20 DIAGNOSIS — I10 Essential (primary) hypertension: Secondary | ICD-10-CM | POA: Diagnosis not present

## 2021-05-20 DIAGNOSIS — Z79899 Other long term (current) drug therapy: Secondary | ICD-10-CM | POA: Diagnosis not present

## 2021-05-20 DIAGNOSIS — R61 Generalized hyperhidrosis: Secondary | ICD-10-CM | POA: Insufficient documentation

## 2021-05-20 DIAGNOSIS — R112 Nausea with vomiting, unspecified: Secondary | ICD-10-CM | POA: Insufficient documentation

## 2021-05-20 DIAGNOSIS — R6883 Chills (without fever): Secondary | ICD-10-CM | POA: Diagnosis not present

## 2021-05-20 DIAGNOSIS — R1013 Epigastric pain: Secondary | ICD-10-CM | POA: Diagnosis not present

## 2021-05-20 DIAGNOSIS — E876 Hypokalemia: Secondary | ICD-10-CM | POA: Insufficient documentation

## 2021-05-20 LAB — COMPREHENSIVE METABOLIC PANEL
ALT: 24 U/L (ref 0–44)
AST: 31 U/L (ref 15–41)
Albumin: 3.7 g/dL (ref 3.5–5.0)
Alkaline Phosphatase: 46 U/L (ref 38–126)
Anion gap: 14 (ref 5–15)
BUN: 6 mg/dL (ref 6–20)
CO2: 22 mmol/L (ref 22–32)
Calcium: 9.3 mg/dL (ref 8.9–10.3)
Chloride: 98 mmol/L (ref 98–111)
Creatinine, Ser: 1.27 mg/dL — ABNORMAL HIGH (ref 0.44–1.00)
GFR, Estimated: 55 mL/min — ABNORMAL LOW (ref >60)
Glucose, Bld: 96 mg/dL (ref 70–99)
Potassium: 3.1 mmol/L — ABNORMAL LOW (ref 3.5–5.1)
Sodium: 134 mmol/L — ABNORMAL LOW (ref 135–145)
Total Bilirubin: 0.8 mg/dL (ref 0.3–1.2)
Total Protein: 7.8 g/dL (ref 6.5–8.1)

## 2021-05-20 LAB — URINALYSIS, MICROSCOPIC (REFLEX)

## 2021-05-20 LAB — HCG, SERUM, QUALITATIVE: Preg, Serum: NEGATIVE

## 2021-05-20 LAB — CBC WITH DIFFERENTIAL/PLATELET
Abs Immature Granulocytes: 0.01 10*3/uL (ref 0.00–0.07)
Basophils Absolute: 0.1 10*3/uL (ref 0.0–0.1)
Basophils Relative: 1 %
Eosinophils Absolute: 0 10*3/uL (ref 0.0–0.5)
Eosinophils Relative: 0 %
HCT: 37.7 % (ref 36.0–46.0)
Hemoglobin: 13.2 g/dL (ref 12.0–15.0)
Immature Granulocytes: 0 %
Lymphocytes Relative: 30 %
Lymphs Abs: 1.9 10*3/uL (ref 0.7–4.0)
MCH: 30.6 pg (ref 26.0–34.0)
MCHC: 35 g/dL (ref 30.0–36.0)
MCV: 87.5 fL (ref 80.0–100.0)
Monocytes Absolute: 0.5 10*3/uL (ref 0.1–1.0)
Monocytes Relative: 8 %
Neutro Abs: 3.7 10*3/uL (ref 1.7–7.7)
Neutrophils Relative %: 61 %
Platelets: 355 10*3/uL (ref 150–400)
RBC: 4.31 MIL/uL (ref 3.87–5.11)
RDW: 13.2 % (ref 11.5–15.5)
WBC: 6.2 10*3/uL (ref 4.0–10.5)
nRBC: 0 % (ref 0.0–0.2)

## 2021-05-20 LAB — URINALYSIS, ROUTINE W REFLEX MICROSCOPIC
Glucose, UA: NEGATIVE mg/dL
Hgb urine dipstick: NEGATIVE
Ketones, ur: 15 mg/dL — AB
Nitrite: NEGATIVE
Protein, ur: NEGATIVE mg/dL
Specific Gravity, Urine: 1.02 (ref 1.005–1.030)
pH: 6 (ref 5.0–8.0)

## 2021-05-20 LAB — LIPASE, BLOOD: Lipase: 25 U/L (ref 11–51)

## 2021-05-20 MED ORDER — SODIUM CHLORIDE 0.9 % IV SOLN
1000.0000 mL | Freq: Once | INTRAVENOUS | Status: AC
  Administered 2021-05-20: 1000 mL via INTRAVENOUS

## 2021-05-20 MED ORDER — METOCLOPRAMIDE HCL 5 MG/ML IJ SOLN
10.0000 mg | Freq: Once | INTRAMUSCULAR | Status: AC
  Administered 2021-05-20: 10 mg via INTRAVENOUS
  Filled 2021-05-20: qty 2

## 2021-05-20 MED ORDER — FAMOTIDINE 20 MG PO TABS
20.0000 mg | ORAL_TABLET | Freq: Two times a day (BID) | ORAL | 0 refills | Status: AC
  Filled 2021-05-20: qty 30, 15d supply, fill #0

## 2021-05-20 MED ORDER — HYDROCODONE-ACETAMINOPHEN 5-325 MG PO TABS
1.0000 | ORAL_TABLET | Freq: Two times a day (BID) | ORAL | 0 refills | Status: AC
  Filled 2021-05-20: qty 6, 3d supply, fill #0

## 2021-05-20 MED ORDER — FAMOTIDINE IN NACL 20-0.9 MG/50ML-% IV SOLN
20.0000 mg | Freq: Once | INTRAVENOUS | Status: AC
  Administered 2021-05-20: 20 mg via INTRAVENOUS
  Filled 2021-05-20: qty 50

## 2021-05-20 MED ORDER — POTASSIUM CHLORIDE CRYS ER 20 MEQ PO TBCR
40.0000 meq | EXTENDED_RELEASE_TABLET | Freq: Once | ORAL | Status: AC
  Administered 2021-05-20: 40 meq via ORAL
  Filled 2021-05-20: qty 2

## 2021-05-20 MED ORDER — PROMETHAZINE HCL 25 MG PO TABS
25.0000 mg | ORAL_TABLET | Freq: Four times a day (QID) | ORAL | 0 refills | Status: DC | PRN
  Filled 2021-05-20: qty 30, 8d supply, fill #0

## 2021-05-20 MED ORDER — MORPHINE SULFATE (PF) 4 MG/ML IV SOLN
4.0000 mg | Freq: Once | INTRAVENOUS | Status: AC
  Administered 2021-05-20: 4 mg via INTRAVENOUS
  Filled 2021-05-20: qty 1

## 2021-05-20 NOTE — ED Triage Notes (Signed)
Abdominal pain since November.  Some N/V.  3 emesis episodes.  Was seen for same 2 days ago.

## 2021-05-20 NOTE — ED Provider Notes (Signed)
[facility] EMERGENCY DEPARTMENT Provider Note   CSN: [id number] Arrival date & time: 05/20/21  1011     History Chief Complaint  Patient presents with   Abdominal Pain    Alice Case is a 40 y.o. female.  HPI   Pt is a 40 y/o female with a h/o HTN who presents to the ED today for eval of epigastric abd pain that has been ongoing for the last 2 weeks. Pain has been constant and she rates pain 7-8/10. Reports associated nausea, vomiting since November but has worsened over the last few weeks. Reports associated chills and sweats. Denies diarrhea, urinary sxs.   Denies ETOH use. Denies heavy NSAID use. She used to use marijuana but has not used it in about 1 month. Denies other drug use.   Past Medical History:  Diagnosis Date   Hypertension     There are no problems to display for this patient.   History reviewed. No pertinent surgical history.   OB History   No obstetric history on file.     No family history on file.  Social History   Tobacco Use   Smoking status: Never   Smokeless tobacco: Never  Substance Use Topics   Alcohol use: Yes    Comment: rare   Drug use: Yes    Types: Marijuana    Home Medications Prior to Admission medications   Medication Sig Start Date End Date Taking? Authorizing Provider  famotidine (PEPCID) 20 MG tablet Take 1 tablet (20 mg total) by mouth 2 (two) times daily. 05/20/21  Yes Lestine Rahe S, PA-C  HYDROcodone-acetaminophen (NORCO/VICODIN) 5-325 MG tablet Take 1 tablet by mouth 2 times daily at 12 noon and 4 pm. 05/20/21  Yes Ngoc Daughtridge S, PA-C  promethazine (PHENERGAN) 25 MG tablet Take 1 tablet (25 mg total) by mouth every 6 (six) hours as needed for nausea or vomiting. 05/20/21  Yes Deandre Stansel S, PA-C  cephALEXin (KEFLEX) 500 MG capsule Take 1 capsule (500 mg total) by mouth 4 (four) times daily. 02/23/21   Palumbo, April, MD  Norethindrone Acetate-Ethinyl Estrad-FE (BLISOVI 24 FE) 1-20 MG-MCG(24)  tablet  10/08/20   [provider]  omeprazole (PRILOSEC) 20 MG capsule Take 1 capsule (20 mg total) by mouth daily. 02/23/21   Palumbo, April, MD  ondansetron (ZOFRAN ODT) 4 MG disintegrating tablet Take 2 tablets (8 mg total) by mouth every 8 (eight) hours as needed for nausea or vomiting. 05/18/21   Henderly, Britni A, PA-C  pantoprazole (PROTONIX) 20 MG tablet Take 1 tablet (20 mg total) by mouth daily. 05/18/21 06/17/21  Henderly, Britni A, PA-C  sucralfate (CARAFATE) 1 g tablet Take 1 tablet (1 g total) by mouth 4 (four) times daily -  with meals and at bedtime for 14 days. 05/18/21 06/01/21  Henderly, Britni A, PA-C  telmisartan-hydrochlorothiazide (MICARDIS HCT) 80-25 MG tablet Take 1 tablet by mouth daily. 01/01/21   [provider]    Allergies    Patient has no known allergies.  Review of Systems   Review of Systems  Constitutional:  Negative for chills and fever.  HENT:  Negative for ear pain and sore throat.   Eyes:  Negative for visual disturbance.  Respiratory:  Negative for cough and shortness of breath.   Cardiovascular:  Negative for chest pain.  Gastrointestinal:  Positive for abdominal distention, nausea and vomiting. Negative for abdominal pain.  Genitourinary:  Negative for dysuria and hematuria.  Musculoskeletal:  Negative for back pain.  Skin:  Negative for rash.  Neurological:  Negative for seizures and syncope.  All other systems reviewed and are negative.  Physical Exam Updated Vital Signs BP 125/85   Pulse 70   Temp 98.6 F (37 C)   Resp 18   Ht 5\' 3"  (1.6 m)   Wt 134.7 kg   LMP 05/04/2021   SpO2 97%   BMI 52.61 kg/m   Physical Exam Vitals and nursing note reviewed.  Constitutional:      General: She is not in acute distress.    Appearance: She is well-developed.  HENT:     Head: Normocephalic and atraumatic.  Eyes:     Conjunctiva/sclera: Conjunctivae normal.  Cardiovascular:     Rate and Rhythm: Normal rate and regular rhythm.      Heart sounds: Normal heart sounds. No murmur heard. Pulmonary:     Effort: Pulmonary effort is normal. No respiratory distress.     Breath sounds: Normal breath sounds. No wheezing, rhonchi or rales.  Abdominal:     Palpations: Abdomen is soft.     Tenderness: There is abdominal tenderness in the epigastric area. There is no guarding or rebound.  Musculoskeletal:     Cervical back: Neck supple.  Skin:    General: Skin is warm and dry.  Neurological:     Mental Status: She is alert.    ED Results / Procedures / Treatments   Labs (all labs ordered are listed, but only abnormal results are displayed) Labs Reviewed  COMPREHENSIVE METABOLIC PANEL - Abnormal; Notable for the following components:      Result Value   Sodium 134 (*)    Potassium 3.1 (*)    Creatinine, Ser 1.27 (*)    GFR, Estimated 55 (*)    All other components within normal limits  URINALYSIS, ROUTINE W REFLEX MICROSCOPIC - Abnormal; Notable for the following components:   APPearance CLOUDY (*)    Bilirubin Urine SMALL (*)    Ketones, ur 15 (*)    Leukocytes,Ua TRACE (*)    All other components within normal limits  URINALYSIS, MICROSCOPIC (REFLEX) - Abnormal; Notable for the following components:   Bacteria, UA MANY (*)    All other components within normal limits  CBC WITH DIFFERENTIAL/PLATELET  LIPASE, BLOOD  HCG, SERUM, QUALITATIVE    EKG None  Radiology CT Abdomen Pelvis W Contrast  Result Date: 05/18/2021 CLINICAL DATA:  Diarrhea and emesis for 1 week. EXAM: CT ABDOMEN AND PELVIS WITH CONTRAST TECHNIQUE: Multidetector CT imaging of the abdomen and pelvis was performed using the standard protocol following bolus administration of intravenous contrast. CONTRAST:  05/20/2021 OMNIPAQUE IOHEXOL 300 MG/ML  SOLN COMPARISON:  CT abdomen pelvis 03/17/2021 FINDINGS: Lower chest: No acute abnormality. Hepatobiliary: No focal liver abnormality is seen. No gallstones, gallbladder wall thickening, or biliary dilatation.  Pancreas: Unremarkable. No pancreatic ductal dilatation or surrounding inflammatory changes. Spleen: Normal in size without focal abnormality. Adrenals/Urinary Tract: Adrenal glands are unremarkable. Kidneys are normal, without renal calculi, focal lesion, or hydronephrosis. Bladder is unremarkable. Stomach/Bowel: Stomach is within normal limits. Appendix appears normal. No evidence of bowel wall thickening, distention, or inflammatory changes. Vascular/Lymphatic: No significant vascular findings are present. No enlarged abdominal or pelvic lymph nodes. Reproductive: Uterus and bilateral adnexa are unremarkable. Other: No abdominal wall hernia or abnormality. No abdominopelvic ascites. Musculoskeletal: No acute or significant osseous findings. IMPRESSION: No acute findings in the abdomen or pelvis. Electronically Signed   By: 03/19/2021 M.D.   On: 05/18/2021 15:08  Procedures Procedures   Medications Ordered in ED Medications  metoCLOPramide (REGLAN) injection 10 mg (10 mg Intravenous Given 05/20/21 1157)  morphine 4 MG/ML injection 4 mg (4 mg Intravenous Given 05/20/21 1157)  0.9 %  sodium chloride infusion ( Intravenous Stopped 05/20/21 1322)  famotidine (PEPCID) IVPB 20 mg premix (0 mg Intravenous Stopped 05/20/21 1236)  potassium chloride SA (KLOR-CON) CR tablet 40 mEq (40 mEq Oral Given 05/20/21 1239)    ED Course  I have reviewed the triage vital signs and the nursing notes.  Pertinent labs & imaging results that were available during my care of the patient were reviewed by me and considered in my medical decision making (see chart for details).    MDM Rules/Calculators/A&P                           40 y/o F presenting for eval of abd pain. Seen here for same 2 days ago. W/u reassuring at that time and had neg ct scan.   Reviewed/interpreted labs CBC wnl CMP with mild hypokalemia, mildly elevated cr likely from nv, normal lfts Lipase neg UA with trace leuks, mayn bacteria and  21-50 squams. Likely contaminant. Dout uti Beta hcg neg  Pt given IVF, pain meds, antiemetics. On reassessment she feels much improved.  She states that she no longer has any pain and she has been able to tolerate p.o.  We discussed that I will give her Rx for Phenergan suppositories so that she is able to tolerate her Protonix at home.  We will also add Pepcid to her regimen.  We will give a very short course of narcotic medication due to her pain though advised on minimal use of this.  She is planning to follow-up with GI.  Advised on return precautions.  She voiced understanding of the plan and reasons to return.  All questions answered.  Patient stable for discharge.  Final Clinical Impression(s) / ED Diagnoses Final diagnoses:  Epigastric pain    Rx / DC Orders ED Discharge Orders          Ordered    promethazine (PHENERGAN) 25 MG tablet  Every 6 hours PRN        05/20/21 1330    HYDROcodone-acetaminophen (NORCO/VICODIN) 5-325 MG tablet  2 times daily        05/20/21 1330    famotidine (PEPCID) 20 MG tablet  2 times daily        05/20/21 1330             Tariya Morrissette, [place], PA-C 05/20/21 1331    Tegeler, Canary Brim, MD 05/20/21 [phone]

## 2021-05-20 NOTE — Discharge Instructions (Addendum)
Take phenergan as directed  Take pepcid as directed   Prescription given for norco. Take medication as directed and do not operate machinery, drive a car, or work while taking this medication as it can make you drowsy.   .[id number]

## 2022-08-21 IMAGING — CT CT ABD-PELV W/ CM
2 of 4 series · 17 of 46 positions shown, 19 images · IV contrast (Omnipaque)
Comparison: CT abdomen pelvis 03/17/2021

CLINICAL DATA: Diarrhea and emesis for 1 week.

EXAM:
CT ABDOMEN AND PELVIS WITH CONTRAST
TECHNIQUE: Multidetector CT imaging of the abdomen and pelvis was performed
using the standard protocol following bolus administration of
intravenous contrast.
CONTRAST:  100mL OMNIPAQUE IOHEXOL 300 MG/ML  SOLN

[Series 2: axial st · axial · 0.98mm/px · z∈[-386,+34]mm · 14 of 93 slices shown, 16 images]
[im 5/93  soft-tissue]
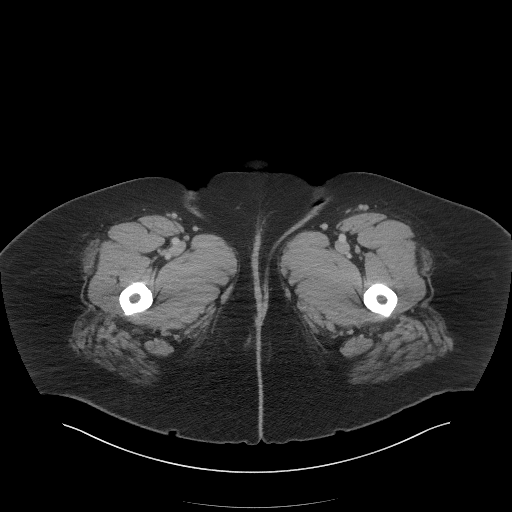
[im 5/93  bone]
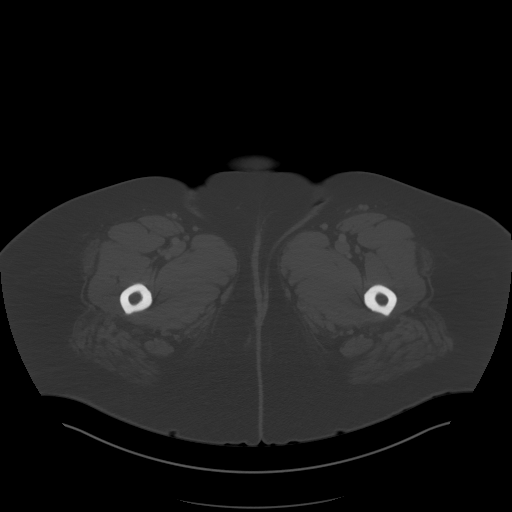
[im 13/93  soft-tissue]
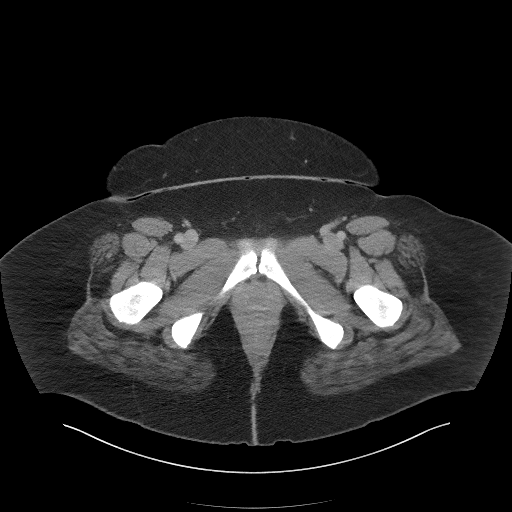
[im 17/93  soft-tissue]
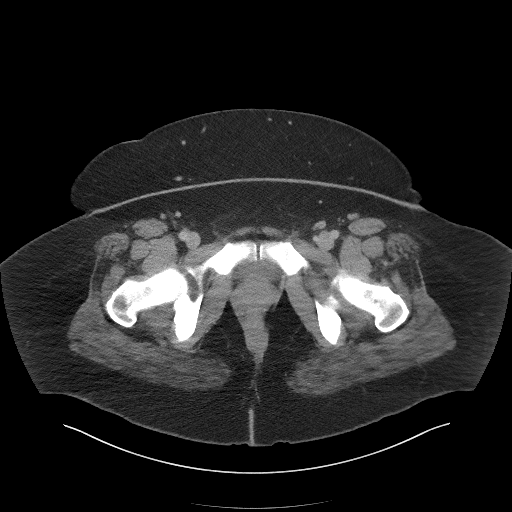
[im 25/93  soft-tissue]
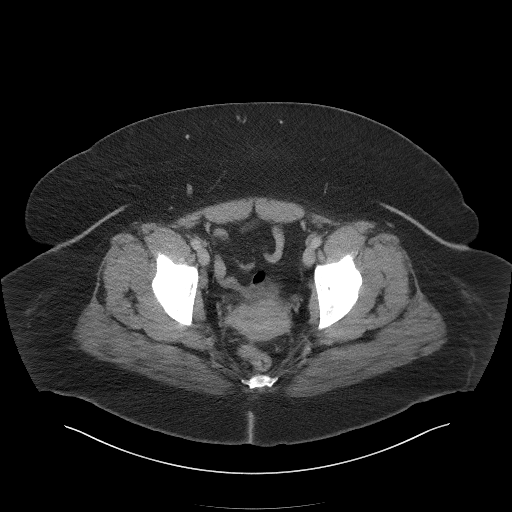
[im 33/93  soft-tissue]
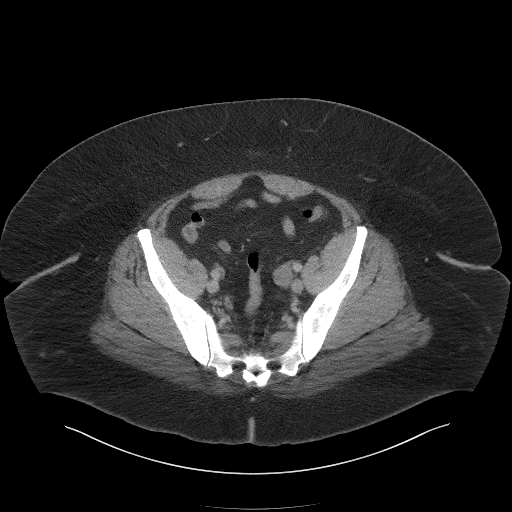
[im 37/93  soft-tissue]
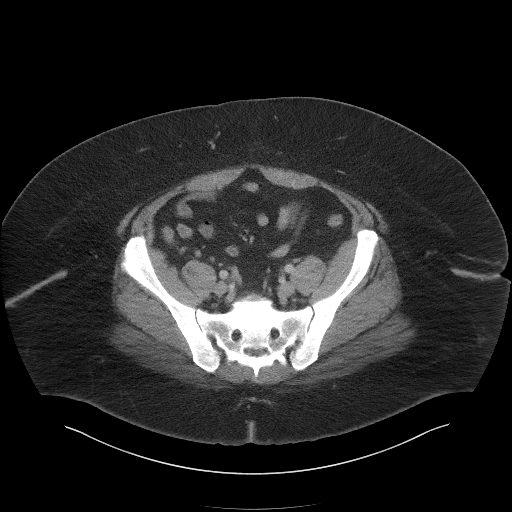
[im 45/93  soft-tissue]
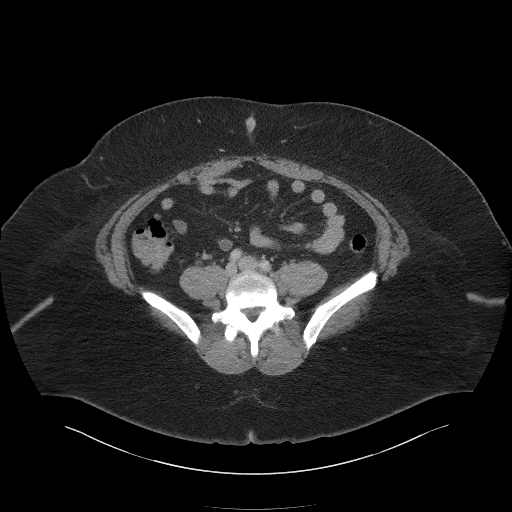
[im 49/93  soft-tissue]
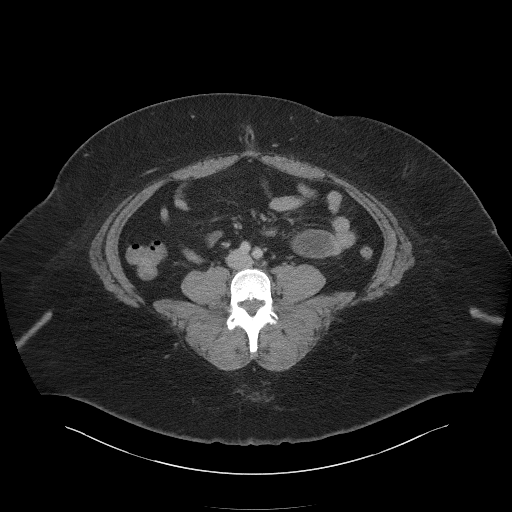
[im 57/93  soft-tissue]
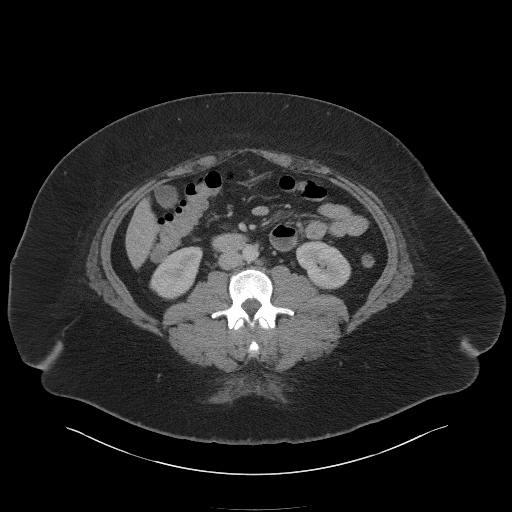
[im 57/93  bone]
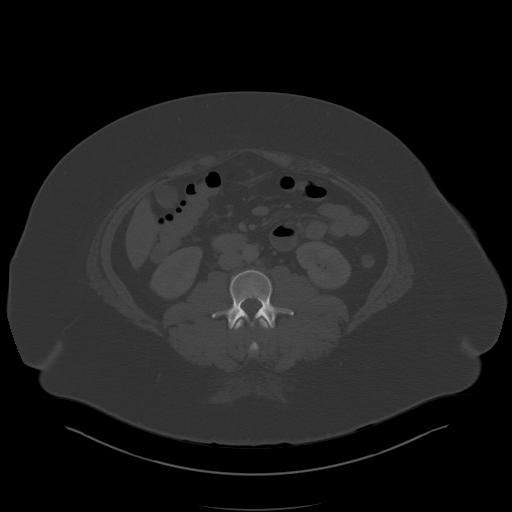
[im 61/93  soft-tissue]
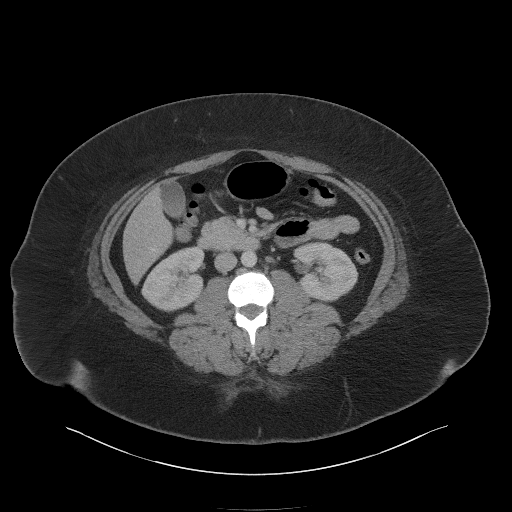
[im 69/93  soft-tissue]
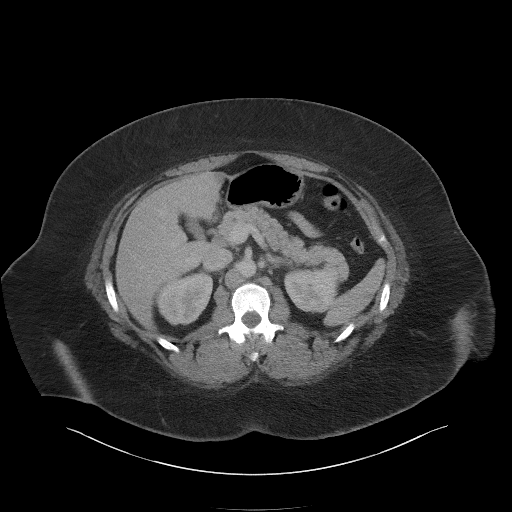
[im 77/93  soft-tissue]
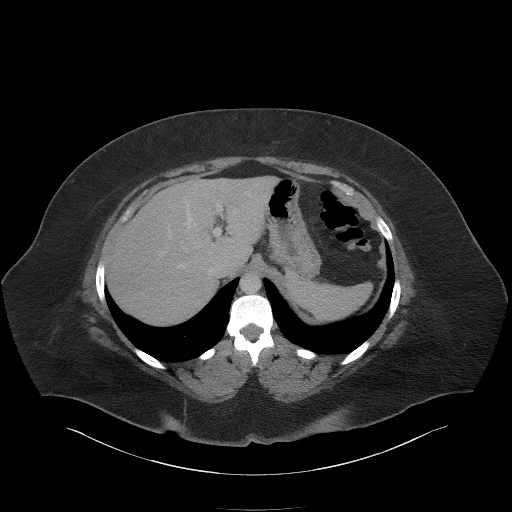
[im 81/93  soft-tissue]
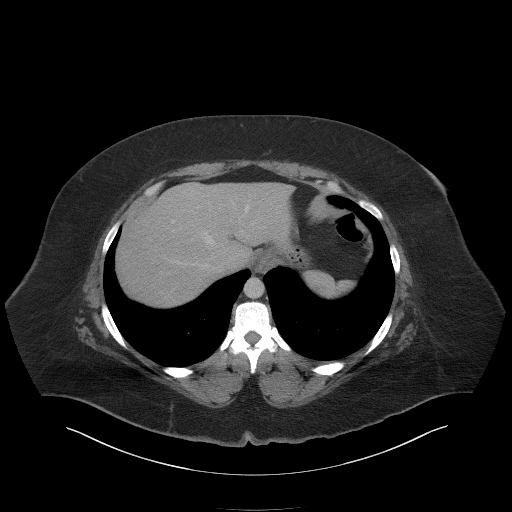
[im 89/93  soft-tissue]
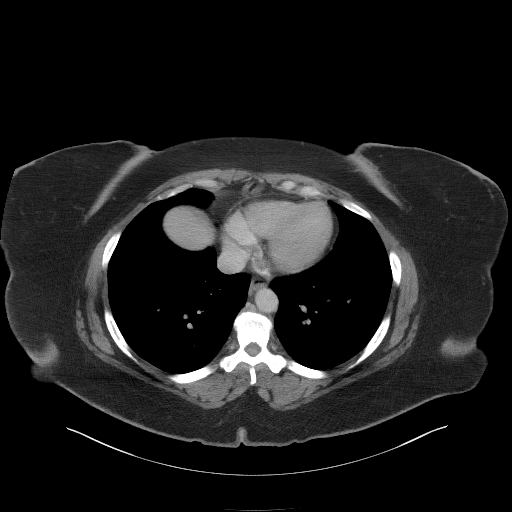

[Series 5: coronal st · coronal · 0.93mm/px · 3 of 113 slices shown]
[im 38/113  soft-tissue]
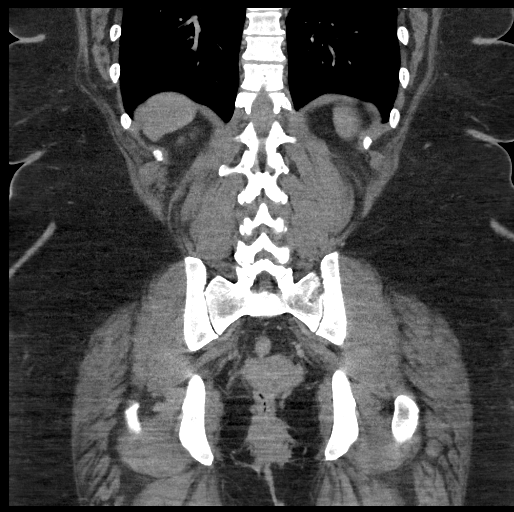
[im 50/113  soft-tissue]
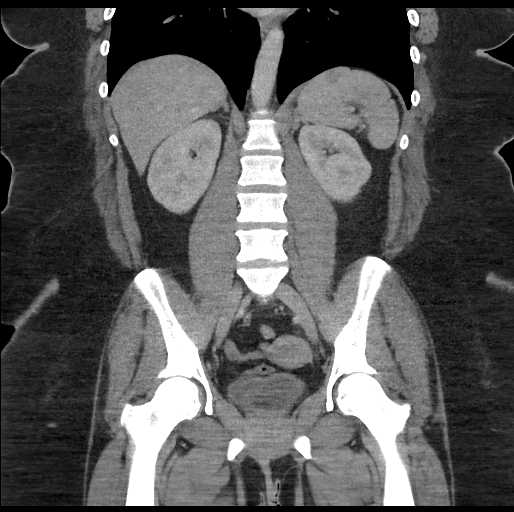
[im 63/113  soft-tissue]
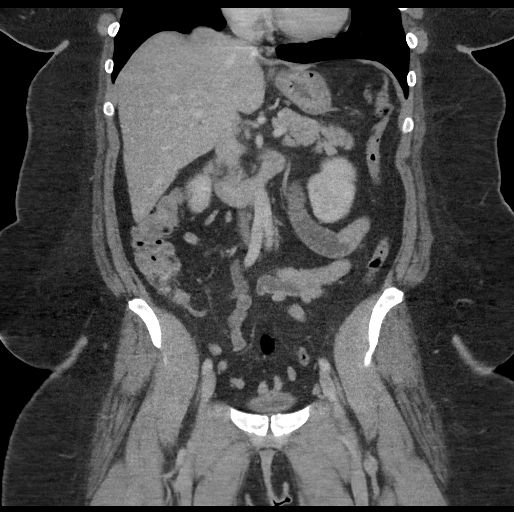

[17 of 46 positions shown; findings below may reference images not displayed]

FINDINGS: Lower chest: No acute abnormality.

Hepatobiliary: No focal liver abnormality is seen. No gallstones,
gallbladder wall thickening, or biliary dilatation.

Pancreas: Unremarkable. No pancreatic ductal dilatation or
surrounding inflammatory changes.

Spleen: Normal in size without focal abnormality.

Adrenals/Urinary Tract: Adrenal glands are unremarkable. Kidneys are
normal, without renal calculi, focal lesion, or hydronephrosis.
Bladder is unremarkable.

Stomach/Bowel: Stomach is within normal limits. Appendix appears
normal. No evidence of bowel wall thickening, distention, or
inflammatory changes.

Vascular/Lymphatic: No significant vascular findings are present. No
enlarged abdominal or pelvic lymph nodes.

Reproductive: Uterus and bilateral adnexa are unremarkable.

Other: No abdominal wall hernia or abnormality. No abdominopelvic
ascites.

Musculoskeletal: No acute or significant osseous findings.
IMPRESSION: No acute findings in the abdomen or pelvis.

## 2023-10-23 HISTORY — PX: ROUX-EN-Y GASTRIC BYPASS: SHX1104

## 2023-11-04 ENCOUNTER — Other Ambulatory Visit: Payer: Self-pay

## 2023-11-04 ENCOUNTER — Encounter (HOSPITAL_BASED_OUTPATIENT_CLINIC_OR_DEPARTMENT_OTHER): Payer: Self-pay

## 2023-11-04 ENCOUNTER — Emergency Department (HOSPITAL_BASED_OUTPATIENT_CLINIC_OR_DEPARTMENT_OTHER)
Admission: EM | Admit: 2023-11-04 | Discharge: 2023-11-05 | Disposition: A | Discharge status: Home / Self Care | Payer: Self-pay | Attending: Emergency Medicine | Admitting: Emergency Medicine

## 2023-11-04 DIAGNOSIS — Z79899 Other long term (current) drug therapy: Secondary | ICD-10-CM | POA: Insufficient documentation

## 2023-11-04 DIAGNOSIS — R1011 Right upper quadrant pain: Secondary | ICD-10-CM | POA: Insufficient documentation

## 2023-11-04 DIAGNOSIS — R112 Nausea with vomiting, unspecified: Secondary | ICD-10-CM | POA: Insufficient documentation

## 2023-11-04 DIAGNOSIS — R1010 Upper abdominal pain, unspecified: Secondary | ICD-10-CM

## 2023-11-04 DIAGNOSIS — Z9884 Bariatric surgery status: Secondary | ICD-10-CM | POA: Insufficient documentation

## 2023-11-04 DIAGNOSIS — I10 Essential (primary) hypertension: Secondary | ICD-10-CM | POA: Insufficient documentation

## 2023-11-04 LAB — URINALYSIS, ROUTINE W REFLEX MICROSCOPIC
Glucose, UA: NEGATIVE mg/dL
Hgb urine dipstick: NEGATIVE
Ketones, ur: >=80 mg/dL — AB
Leukocytes,Ua: NEGATIVE
Nitrite: NEGATIVE
Protein, ur: 100 mg/dL — AB
Specific Gravity, Urine: >=1.03 (ref 1.005–1.030)
pH: 5.5 (ref 5.0–8.0)

## 2023-11-04 LAB — CBC
HCT: 42 % (ref 36.0–46.0)
Hemoglobin: 14 g/dL (ref 12.0–15.0)
MCH: 29.9 pg (ref 26.0–34.0)
MCHC: 33.3 g/dL (ref 30.0–36.0)
MCV: 89.7 fL (ref 80.0–100.0)
Platelets: 415 10*3/uL — ABNORMAL HIGH (ref 150–400)
RBC: 4.68 MIL/uL (ref 3.87–5.11)
RDW: 14.4 % (ref 11.5–15.5)
WBC: 9.5 10*3/uL (ref 4.0–10.5)
nRBC: 0 % (ref 0.0–0.2)

## 2023-11-04 LAB — COMPREHENSIVE METABOLIC PANEL
ALT: 21 U/L (ref 0–44)
AST: 20 U/L (ref 15–41)
Albumin: 3.6 g/dL (ref 3.5–5.0)
Alkaline Phosphatase: 40 U/L (ref 38–126)
Anion gap: 13 (ref 5–15)
BUN: 17 mg/dL (ref 6–20)
CO2: 18 mmol/L — ABNORMAL LOW (ref 22–32)
Calcium: 8.7 mg/dL — ABNORMAL LOW (ref 8.9–10.3)
Chloride: 104 mmol/L (ref 98–111)
Creatinine, Ser: 1.04 mg/dL — ABNORMAL HIGH (ref 0.44–1.00)
GFR, Estimated: >60 mL/min (ref >60)
Glucose, Bld: 92 mg/dL (ref 70–99)
Potassium: 3.7 mmol/L (ref 3.5–5.1)
Sodium: 135 mmol/L (ref 135–145)
Total Bilirubin: 0.9 mg/dL (ref <1.2)
Total Protein: 8 g/dL (ref 6.5–8.1)

## 2023-11-04 LAB — URINALYSIS, MICROSCOPIC (REFLEX)

## 2023-11-04 LAB — LIPASE, BLOOD: Lipase: 23 U/L (ref 11–51)

## 2023-11-04 LAB — PREGNANCY, URINE: Preg Test, Ur: NEGATIVE

## 2023-11-04 MED ORDER — SODIUM CHLORIDE 0.9 % IV SOLN
12.5000 mg | Freq: Once | INTRAVENOUS | Status: AC
  Administered 2023-11-04: 12.5 mg via INTRAVENOUS
  Filled 2023-11-04: qty 0.5

## 2023-11-04 MED ORDER — PROMETHAZINE HCL 25 MG PO TABS: 25.0000 mg | ORAL_TABLET | Freq: Four times a day (QID) | ORAL | 0 refills | Status: AC | PRN

## 2023-11-04 MED ORDER — METOCLOPRAMIDE HCL 5 MG/ML IJ SOLN
10.0000 mg | Freq: Once | INTRAMUSCULAR | Status: AC
Start: 2023-11-04 — End: 2023-11-04
  Administered 2023-11-04: 10 mg via INTRAVENOUS
  Filled 2023-11-04: qty 2

## 2023-11-04 MED ORDER — PROMETHAZINE HCL 25 MG/ML IJ SOLN
INTRAMUSCULAR | Status: AC
  Filled 2023-11-04: qty 1

## 2023-11-04 MED ORDER — LACTATED RINGERS IV BOLUS
1000.0000 mL | Freq: Once | INTRAVENOUS | Status: AC
  Administered 2023-11-04: 1000 mL via INTRAVENOUS

## 2023-11-04 MED ORDER — PANTOPRAZOLE SODIUM 40 MG IV SOLR
40.0000 mg | Freq: Once | INTRAVENOUS | Status: AC
  Administered 2023-11-04: 40 mg via INTRAVENOUS
  Filled 2023-11-04: qty 10

## 2023-11-04 NOTE — ED Triage Notes (Signed)
The patient has been vomiting today. 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

## 2023-11-04 NOTE — ED Provider Notes (Signed)
[place] EMERGENCY DEPARTMENT AT [facility] Provider Note   CSN: [id number] Arrival date & time: 11/04/23  1815     History  Chief Complaint  Patient presents with   Emesis    Alice Case is a 42 y.o. female who presents the emergency department complaining of vomiting.  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  Starting today she began gagging has not been able to stop vomiting.  She took prescribed Zofran without relief.  Having a little bit of soreness in her surgical site, but no significant abdominal pain.   Emesis      Home Medications Prior to Admission medications   Medication Sig Start Date End Date Taking? Authorizing Provider  cephALEXin (KEFLEX) 500 MG capsule Take 1 capsule (500 mg total) by mouth 4 (four) times daily. 02/23/21   Palumbo, April, MD  famotidine (PEPCID) 20 MG tablet Take 1 tablet (20 mg total) by mouth 2 (two) times daily. 05/20/21   Couture, Cortni S, PA-C  HYDROcodone-acetaminophen (NORCO/VICODIN) 5-325 MG tablet Take 1 tablet by mouth 2 times daily at 12 noon and 4 pm. 05/20/21   Couture, Cortni S, PA-C  Norethindrone Acetate-Ethinyl Estrad-FE (BLISOVI 24 FE) 1-20 MG-MCG(24) tablet  10/08/20   [provider]  omeprazole (PRILOSEC) 20 MG capsule Take 1 capsule (20 mg total) by mouth daily. 02/23/21   Palumbo, April, MD  ondansetron (ZOFRAN ODT) 4 MG disintegrating tablet Take 2 tablets (8 mg total) by mouth every 8 (eight) hours as needed for nausea or vomiting. 05/18/21   Henderly, Britni A, PA-C  pantoprazole (PROTONIX) 20 MG tablet Take 1 tablet (20 mg total) by mouth daily. 05/18/21 06/17/21  Henderly, Britni A, PA-C  promethazine (PHENERGAN) 25 MG tablet Take 1 tablet (25 mg total) by mouth every 6 (six) hours as needed for nausea or vomiting. 11/04/23   Daralyn Bert T, PA-C  sucralfate (CARAFATE) 1 g tablet Take 1 tablet (1 g total) by mouth 4 (four) times daily -  with meals and at  bedtime for 14 days. 05/18/21 06/01/21  Henderly, Britni A, PA-C  telmisartan-hydrochlorothiazide (MICARDIS HCT) 80-25 MG tablet Take 1 tablet by mouth daily. 01/01/21   [provider]      Allergies    Patient has no known allergies.    Review of Systems   Review of Systems  Gastrointestinal:  Positive for nausea and vomiting.  All other systems reviewed and are negative.   Physical Exam Updated Vital Signs BP (!) 133/97 (BP Location: Right Arm)   Pulse 92   Temp 98.8 F (37.1 C) (Oral)   Resp 18   Ht 5\' 3"  (1.6 m)   Wt 130.6 kg   LMP 10/30/2023 (Exact Date)   SpO2 98%   BMI 51.02 kg/m  Physical Exam Vitals and nursing note reviewed.  Constitutional:      Appearance: Normal appearance.     Comments: Tearful  HENT:     Head: Normocephalic and atraumatic.  Eyes:     Conjunctiva/sclera: Conjunctivae normal.  Cardiovascular:     Rate and Rhythm: Normal rate and regular rhythm.  Pulmonary:     Effort: Pulmonary effort is normal. No respiratory distress.     Breath sounds: Normal breath sounds.  Abdominal:     General: There is no distension.     Palpations: Abdomen is soft.     Tenderness: There is no abdominal tenderness.     Comments: Very mild RUQ  soreness over surgical site, abdomen soft, non-distended  Skin:    General: Skin is warm and dry.  Neurological:     General: No focal deficit present.     Mental Status: She is alert.     ED Results / Procedures / Treatments   Labs (all labs ordered are listed, but only abnormal results are displayed) Labs Reviewed  CBC - Abnormal; Notable for the following components:      Result Value   Platelets 415 (*)    All other components within normal limits  URINALYSIS, ROUTINE W REFLEX MICROSCOPIC - Abnormal; Notable for the following components:   APPearance HAZY (*)    Bilirubin Urine MODERATE (*)    Ketones, ur >=80 (*)    Protein, ur 100 (*)    All other components within normal limits  URINALYSIS,  MICROSCOPIC (REFLEX) - Abnormal; Notable for the following components:   Bacteria, UA FEW (*)    All other components within normal limits  COMPREHENSIVE METABOLIC PANEL - Abnormal; Notable for the following components:   CO2 18 (*)    Creatinine, Ser 1.04 (*)    Calcium 8.7 (*)    All other components within normal limits  PREGNANCY, URINE  LIPASE, BLOOD    EKG None  Radiology No results found.  Procedures Procedures    Medications Ordered in ED Medications  lactated ringers bolus 1,000 mL (0 mLs Intravenous Stopped 11/04/23 2154)  promethazine (PHENERGAN) 12.5 mg in sodium chloride 0.9 % 50 mL IVPB (0 mg Intravenous Stopped 11/04/23 2117)  pantoprazole (PROTONIX) injection 40 mg (40 mg Intravenous Given 11/04/23 2241)  metoCLOPramide (REGLAN) injection 10 mg (10 mg Intravenous Given 11/04/23 2241)    ED Course/ Medical Decision Making/ A&P                                 Medical Decision Making Amount and/or Complexity of Data Reviewed Labs: ordered.  This patient is a 42 y.o. female  who presents to the ED for concern of vomiting starting this AM.   Differential diagnoses prior to evaluation: The emergent differential diagnosis includes, but is not limited to,  ACS/MI, Boerhaave's, DKA, elevated ICP, Ischemic bowel, Sepsis, Drug-related (toxicity, THC hyperemesis, ETOH, withdrawal), Appendicitis, Bowel obstruction, Electrolyte abnormalities, Pancreatitis, Biliary colic, Gastroenteritis, Gastroparesis, Hepatitis, Migraine, Thyroid disease, Renal colic, GERD/PUD, UTI, Ovarian torsion, Pregnancy, Hyperemesis gravidarum. This is not an exhaustive differential.   Past Medical History / Co-morbidities / Social History: HTN, laparoscopic roux-en-y gastric bypass  Additional history: Chart reviewed. Pertinent results include: surgery performed on 11/25 by Dr Cliffton Asters at [facility]  Physical Exam: Physical exam performed. The pertinent findings  include: Patient tearful on exam, hunched over in exam bed.  Complaining of gagging and vomiting.  Abdomen soft, with some very mild soreness over her laparoscopic site, otherwise nontender abdomen.  Lab Tests/Imaging studies: I personally interpreted labs/imaging and the pertinent results include: CMP normal.  CMP with CO2 18, kidney function stable compared to prior.  UA with moderate bilirubin, ketones, protein, suspect dehydration.  Urine pregnancy negative.  Normal lipase.  CT abdomen pelvis pending at time of shift change. Radiology tech indicated patient will need PO contrast since she is < 6 weeks post-op.   Medications: I ordered medication including IVF, phenegran, reglan, protonix.  I have reviewed the patients home medicines and have made adjustments as needed.   Disposition: Patient discussed and  care transferred to attending physician Dr. Judd Lien at shift change. Please see his/her note for further details regarding further ED course and disposition. Plan at time of handoff is follow up on CT imaging. Anticipate if reassuring imaging, discharge to home with phenegran prescription and follow up with surgical team as scheduled.    Final Clinical Impression(s) / ED Diagnoses Final diagnoses:  Nausea and vomiting, unspecified vomiting type  Upper abdominal pain  History of Roux-en-Y gastric bypass    Rx / DC Orders ED Discharge Orders          Ordered    promethazine (PHENERGAN) 25 MG tablet  Every 6 hours PRN        11/04/23 2257           Portions of this report may have been transcribed using voice recognition software. Every effort was made to ensure accuracy; however, inadvertent computerized transcription errors may be present.    Jeanella Flattery 11/04/23 2308    Geoffery Lyons, MD 11/05/23 0120

## 2023-11-04 NOTE — Discharge Instructions (Addendum)
You were seen in the ER for vomiting.  Your blood work was normal. Your urine sample indicated you were dehydrated so we gave you IV fluids and nausea medication.  Your CT scan showed no evidence for bowel obstruction or other surgical complication.  I have prescribed different nausea medication you can use as needed. I recommend continuing your prescribed pain medication and follow up with your surgeon as scheduled.   Continue to monitor how you're doing and return to the ER for new or worsening symptoms.

## 2023-11-05 ENCOUNTER — Encounter (HOSPITAL_BASED_OUTPATIENT_CLINIC_OR_DEPARTMENT_OTHER): Payer: Self-pay

## 2023-11-05 ENCOUNTER — Emergency Department (HOSPITAL_BASED_OUTPATIENT_CLINIC_OR_DEPARTMENT_OTHER): Payer: Self-pay

## 2023-11-05 MED ORDER — IOHEXOL 300 MG/ML  SOLN
100.0000 mL | Freq: Once | INTRAMUSCULAR | Status: AC | PRN
  Administered 2023-11-05: 100 mL via INTRAVENOUS

## 2023-11-05 MED ORDER — IOHEXOL 9 MG/ML PO SOLN
500.0000 mL | ORAL | Status: DC
  Administered 2023-11-05: 500 mL via ORAL

## 2023-11-05 NOTE — ED Notes (Signed)
Patient transported to CT 

## 2023-11-05 NOTE — ED Notes (Signed)
Reviewed D/C information with the patient, pt verbalized understanding. No additional concerns at this time.
